# Patient Record
Sex: Male | Born: 1995 | Race: White | Hispanic: No | Marital: Single | Smoking: Never smoker
Health system: Southern US, Community
[De-identification: ages and names within clinical notes are randomized; demographics above are authoritative.]

---
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
## 2020-05-21 ENCOUNTER — Emergency Department

## 2020-05-21 ENCOUNTER — Other Ambulatory Visit: Payer: Self-pay

## 2020-05-21 ENCOUNTER — Encounter: Payer: Self-pay | Admitting: Emergency Medicine

## 2020-05-21 ENCOUNTER — Emergency Department
Admission: EM | Admit: 2020-05-21 | Discharge: 2020-05-21 | Disposition: A | Discharge status: Home / Self Care | Attending: Emergency Medicine | Admitting: Emergency Medicine

## 2020-05-21 DIAGNOSIS — R109 Unspecified abdominal pain: Secondary | ICD-10-CM | POA: Diagnosis present

## 2020-05-21 DIAGNOSIS — N200 Calculus of kidney: Secondary | ICD-10-CM | POA: Insufficient documentation

## 2020-05-21 LAB — URINALYSIS, COMPLETE (UACMP) WITH MICROSCOPIC
Bacteria, UA: NONE SEEN
Bilirubin Urine: NEGATIVE
Glucose, UA: NEGATIVE mg/dL
Ketones, ur: NEGATIVE mg/dL
Leukocytes,Ua: NEGATIVE
Nitrite: NEGATIVE
Protein, ur: 30 mg/dL — AB
Specific Gravity, Urine: 1.029 (ref 1.005–1.030)
Squamous Epithelial / HPF: NONE SEEN (ref 0–5)
pH: 5 (ref 5.0–8.0)

## 2020-05-21 LAB — CBC
HCT: 46.5 % (ref 39.0–52.0)
Hemoglobin: 16.4 g/dL (ref 13.0–17.0)
MCH: 29.8 pg (ref 26.0–34.0)
MCHC: 35.3 g/dL (ref 30.0–36.0)
MCV: 84.5 fL (ref 80.0–100.0)
Platelets: 242 10*3/uL (ref 150–400)
RBC: 5.5 MIL/uL (ref 4.22–5.81)
RDW: 11.8 % (ref 11.5–15.5)
WBC: 13.3 10*3/uL — ABNORMAL HIGH (ref 4.0–10.5)
nRBC: 0 % (ref 0.0–0.2)

## 2020-05-21 LAB — BASIC METABOLIC PANEL
Anion gap: 11 (ref 5–15)
BUN: 15 mg/dL (ref 6–20)
CO2: 25 mmol/L (ref 22–32)
Calcium: 9.4 mg/dL (ref 8.9–10.3)
Chloride: 102 mmol/L (ref 98–111)
Creatinine, Ser: 1.22 mg/dL (ref 0.61–1.24)
GFR calc Af Amer: >60 mL/min (ref >60)
GFR calc non Af Amer: >60 mL/min (ref >60)
Glucose, Bld: 169 mg/dL — ABNORMAL HIGH (ref 70–99)
Potassium: 3.7 mmol/L (ref 3.5–5.1)
Sodium: 138 mmol/L (ref 135–145)

## 2020-05-21 MED ORDER — NAPROXEN 500 MG PO TABS: 500.0000 mg | ORAL_TABLET | Freq: Two times a day (BID) | ORAL | 0 refills | Status: AC

## 2020-05-21 MED ORDER — ONDANSETRON HCL 4 MG/2ML IJ SOLN
4.0000 mg | Freq: Once | INTRAMUSCULAR | Status: AC
  Administered 2020-05-21: 4 mg via INTRAVENOUS

## 2020-05-21 MED ORDER — HYDROCODONE-ACETAMINOPHEN 5-325 MG PO TABS: 1.0000 | ORAL_TABLET | Freq: Four times a day (QID) | ORAL | 0 refills | Status: AC | PRN

## 2020-05-21 MED ORDER — TAMSULOSIN HCL 0.4 MG PO CAPS: 0.4000 mg | ORAL_CAPSULE | Freq: Every day | ORAL | 0 refills | Status: AC

## 2020-05-21 MED ORDER — KETOROLAC TROMETHAMINE 30 MG/ML IJ SOLN
INTRAMUSCULAR | Status: AC
  Filled 2020-05-21: qty 1

## 2020-05-21 MED ORDER — KETOROLAC TROMETHAMINE 30 MG/ML IJ SOLN
30.0000 mg | Freq: Once | INTRAMUSCULAR | Status: AC
  Administered 2020-05-21: 30 mg via INTRAVENOUS
  Filled 2020-05-21: qty 1

## 2020-05-21 MED ORDER — FENTANYL CITRATE (PF) 100 MCG/2ML IJ SOLN
50.0000 ug | INTRAMUSCULAR | Status: DC | PRN
  Administered 2020-05-21: 50 ug via INTRAVENOUS

## 2020-05-21 NOTE — ED Triage Notes (Signed)
Pt arrived via POV with c/o left flank pain since 9am, along with vomiting. Pt has hx of kidney stones on L side feels the same.

## 2020-05-21 NOTE — ED Provider Notes (Signed)
[facility] Emergency Department Provider Note ____________________________________________   First MD Initiated Contact with Patient 05/21/20 1420     (approximate)  I have reviewed the triage vital signs and the nursing notes.   HISTORY  Chief Complaint Flank Pain  HPI Martin Sheppard. is a 24 y.o. male who presents to the emergency department for treatment and evaluation of left flank pain that started acutely at 9 AM this morning.  He is also had some nausea and vomiting.  History of kidney stones few months ago.  No alleviating measures attempted prior to arrival.         History reviewed. No pertinent past medical history.  There are no problems to display for this patient.   History reviewed. No pertinent surgical history.  Prior to Admission medications   Medication Sig Start Date End Date Taking? Authorizing Provider  HYDROcodone-acetaminophen (NORCO/VICODIN) 5-325 MG tablet Take 1 tablet by mouth every 6 (six) hours as needed for up to 3 days for severe pain. 05/21/20 05/24/20  Larri Brewton, Rulon Eisenmenger B, FNP  naproxen (NAPROSYN) 500 MG tablet Take 1 tablet (500 mg total) by mouth 2 (two) times daily with a meal. 05/21/20   Cristyn Crossno B, FNP  tamsulosin (FLOMAX) 0.4 MG CAPS capsule Take 1 capsule (0.4 mg total) by mouth daily. 05/21/20   Chinita Pester, FNP    Allergies Patient has no known allergies.  No family history on file.  Social History Social History   Tobacco Use  . Smoking status: Never Smoker  . Smokeless tobacco: Never Used  Substance Use Topics  . Alcohol use: Not on file  . Drug use: Not on file    Review of Systems  Constitutional: No fever/chills Eyes: No visual changes. ENT: No sore throat. Cardiovascular: Denies chest pain. Respiratory: Denies shortness of breath. Gastrointestinal: Positive for nausea and vomiting no diarrhea.  No constipation. Genitourinary: Negative for dysuria. Musculoskeletal: Positive  for left flank pain Skin: Negative for rash. Neurological: Negative for headaches, focal weakness or numbness.  ____________________________________________   PHYSICAL EXAM:  VITAL SIGNS: ED Triage Vitals [05/21/20 1250]  Enc Vitals Group     BP 121/65     Pulse Rate 88     Resp 18     Temp 98 F (36.7 C)     Temp Source Oral     SpO2 100 %     Weight 200 lb (90.7 kg)     Height 5\' 8"  (1.727 m)     Head Circumference      Peak Flow      Pain Score 10     Pain Loc      Pain Edu?      Excl. in GC?     Constitutional: Alert and oriented. Well appearing and in no acute distress. Eyes: Conjunctivae are normal. PERRL. EOMI. Head: Atraumatic. Nose: No congestion/rhinnorhea. Mouth/Throat: Mucous membranes are moist.  Oropharynx non-erythematous. Neck: No stridor.   Hematological/Lymphatic/Immunilogical: No cervical lymphadenopathy. Cardiovascular: Normal rate, regular rhythm. Grossly normal heart sounds.  Good peripheral circulation. Respiratory: Normal respiratory effort.  No retractions. Lungs CTAB. Gastrointestinal: Soft and nontender. No distention. No abdominal bruits. No CVA tenderness. Genitourinary:  Musculoskeletal: No lower extremity tenderness nor edema.  No joint effusions. Neurologic:  Normal speech and language. No gross focal neurologic deficits are appreciated. No gait instability. Skin:  Skin is warm, dry and intact. No rash noted. Psychiatric: Mood and affect are normal. Speech and behavior are normal.  ____________________________________________  LABS (all labs ordered are listed, but only abnormal results are displayed)  Labs Reviewed  URINALYSIS, COMPLETE (UACMP) WITH MICROSCOPIC - Abnormal; Notable for the following components:      Result Value   Color, Urine YELLOW (*)    APPearance HAZY (*)    Hgb urine dipstick MODERATE (*)    Protein, ur 30 (*)    All other components within normal limits  BASIC METABOLIC PANEL - Abnormal; Notable for the  following components:   Glucose, Bld 169 (*)    All other components within normal limits  CBC - Abnormal; Notable for the following components:   WBC 13.3 (*)    All other components within normal limits   ____________________________________________  EKG  Not indicated ____________________________________________  RADIOLOGY  ED MD interpretation:    2 mm calculus in the left UVJ without hydronephrosis.  I, Sherrie George, personally viewed and evaluated these images (plain radiographs) as part of my medical decision making, as well as reviewing the written report by the radiologist.  Official radiology report(s): CT Renal Stone Study  Result Date: 05/21/2020 CLINICAL DATA:  Left flank pain with vomiting EXAM: CT ABDOMEN AND PELVIS WITHOUT CONTRAST TECHNIQUE: Multidetector CT imaging of the abdomen and pelvis was performed following the standard protocol without oral or IV contrast. COMPARISON:  None. FINDINGS: Lower chest: Lung bases are clear. There is air in the distal most aspect of the esophagus. Hepatobiliary: No focal liver lesions are evident on this noncontrast enhanced study. Gallbladder wall is not appreciably thickened. There is no biliary duct dilatation. Pancreas: There is no pancreatic mass or inflammatory focus. Spleen: No splenic lesions are evident. Adrenals/Urinary Tract: Adrenals bilaterally appear unremarkable. Kidneys bilaterally show no appreciable mass or hydronephrosis on either side. There are scattered tiny calculi in the left kidney. No appreciable calculi in the right kidney. There is a 2 x 1 mm calculus at the left ureterovesical junction. No other ureteral calculi evident. Urinary bladder is midline with wall thickness within normal limits. Stomach/Bowel: There is no appreciable bowel wall or mesenteric thickening. Terminal ileum appears unremarkable. There is no appreciable free air or portal venous air. Vascular/Lymphatic: No abdominal aortic aneurysm. No  vascular lesions evident. There is no evident adenopathy in the abdomen or pelvis. Reproductive: There are occasional prostatic calculi. Prostate and seminal vesicles are normal in size and contour. No evident pelvic mass. Other: Appendix appears normal. No evident abscess or ascites in the abdomen or pelvis. Musculoskeletal: There are no blastic or lytic bone lesions. No abdominal wall or intramuscular lesions. IMPRESSION: 1. 2 x 1 mm calculus left ureterovesical junction without appreciable hydronephrosis. Tiny calculi noted within the left kidney, nonobstructing. 2. No bowel wall thickening or bowel obstruction. No abscess in the abdomen or pelvis. Appendix appears normal. Electronically Signed   By: Lowella Grip III M.D.   On: 05/21/2020 14:13    ____________________________________________   PROCEDURES  Procedure(s) performed (including Critical Care):  Procedures  ____________________________________________   INITIAL IMPRESSION / ASSESSMENT AND PLAN     24 year old male presenting to the emergency department for treatment and evaluation of sudden onset left flank pain.  See HPI for further details.  DIFFERENTIAL DIAGNOSIS  Ureterolithiasis, nephrolithiasis, STI, pyelonephritis  ED COURSE  Review of labs and CT performed while patient awaiting ER room assignment shows a 2 x 1 mm stone at the UVJ on the left.  Pain is well controlled at this time.  Plan will be to give him IV Toradol and discharge  him home with Flomax, Norco, and Naprosyn.  He is to follow-up with urology for symptoms that are not improving over the week.  He is to return to the emergency department for symptoms of concern if he is unable to see primary care or the urologist. ____________________________________________   FINAL CLINICAL IMPRESSION(S) / ED DIAGNOSES  Final diagnoses:  Nephrolithiasis     ED Discharge Orders         Ordered    tamsulosin (FLOMAX) 0.4 MG CAPS capsule  Daily     05/21/20  1451    HYDROcodone-acetaminophen (NORCO/VICODIN) 5-325 MG tablet  Every 6 hours PRN     05/21/20 1451    naproxen (NAPROSYN) 500 MG tablet  2 times daily with meals     05/21/20 1451           Martin Sheppard. was evaluated in Emergency Department on 05/21/2020 for the symptoms described in the history of present illness. He was evaluated in the context of the global COVID-19 pandemic, which necessitated consideration that the patient might be at risk for infection with the SARS-CoV-2 virus that causes COVID-19. Institutional protocols and algorithms that pertain to the evaluation of patients at risk for COVID-19 are in a state of rapid change based on information released by regulatory bodies including the CDC and federal and state organizations. These policies and algorithms were followed during the patient's care in the ED.   Note:  This document was prepared using Dragon voice recognition software and may include unintentional dictation errors.   Chinita Pester, FNP 05/21/20 1456    Jene Every, MD 05/21/20 [phone]

## 2020-05-21 NOTE — Discharge Instructions (Signed)
Please follow-up with the urologist if you are still having pain after 1 week.  Take medication as prescribed and until finished.  Return to the emergency department for symptoms of change or worsen if you are unable to schedule an appointment with your primary care provider or the urologist.

## 2021-07-02 IMAGING — CT CT RENAL STONE PROTOCOL
2 of 4 series · 16 of 46 positions shown, 18 images · non-contrast
Comparison: None.

CLINICAL DATA: Left flank pain with vomiting

EXAM:
CT ABDOMEN AND PELVIS WITHOUT CONTRAST
TECHNIQUE: Multidetector CT imaging of the abdomen and pelvis was performed
following the standard protocol without oral or IV contrast.

[Series 2: stone full standard · axial · 0.72mm/px · z∈[-526,-66]mm · 13 of 102 slices shown, 15 images]
[im 5/102  soft-tissue]
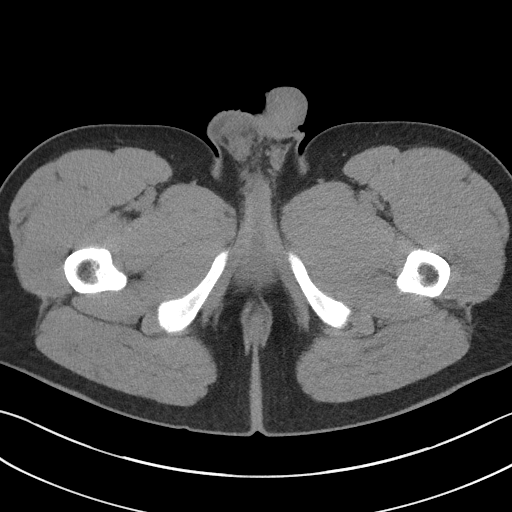
[im 5/102  bone]
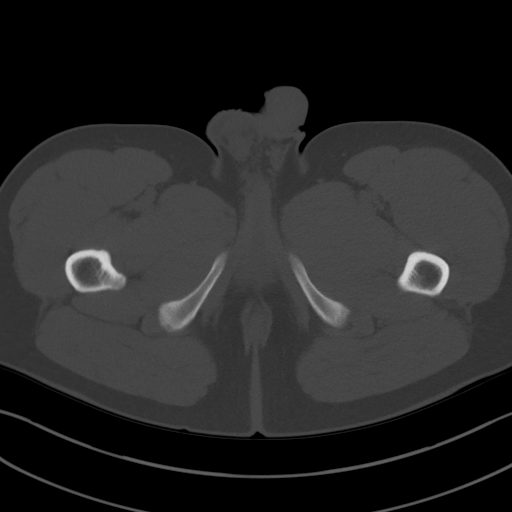
[im 13/102  soft-tissue]
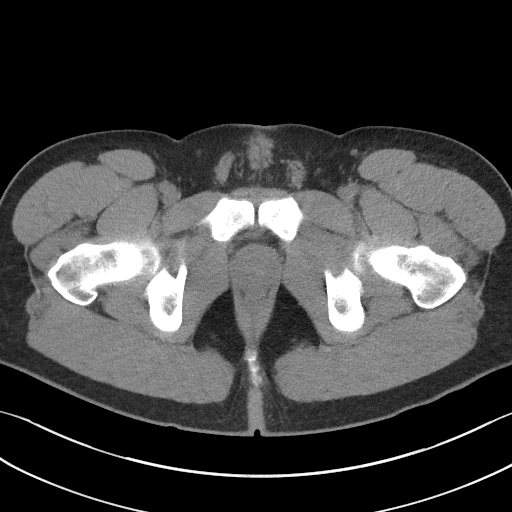
[im 21/102  soft-tissue]
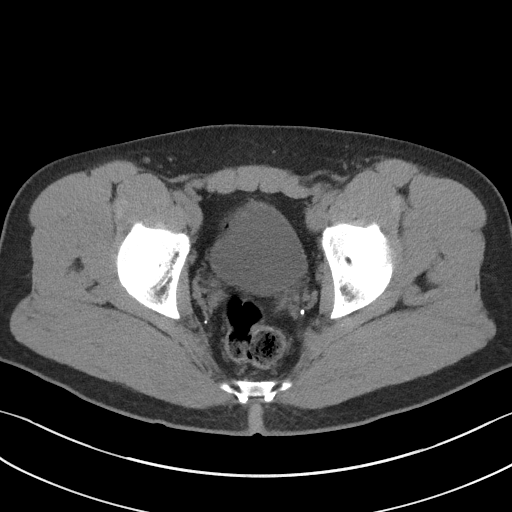
[im 29/102  soft-tissue]
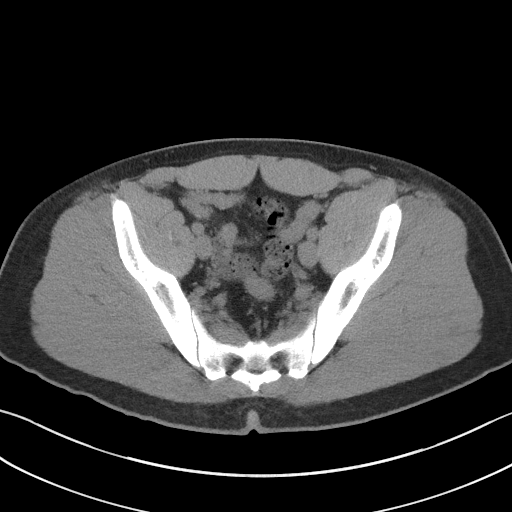
[im 37/102  soft-tissue]
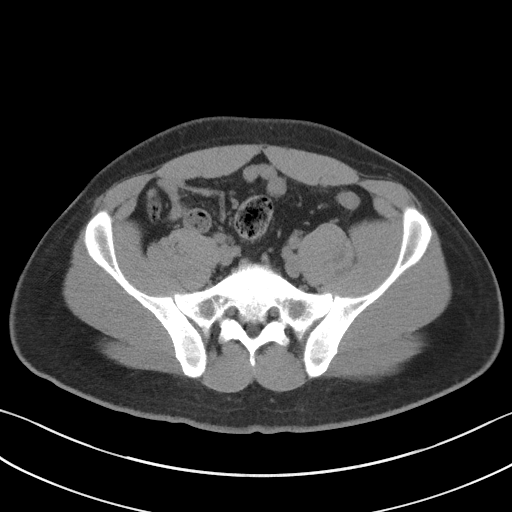
[im 45/102  soft-tissue]
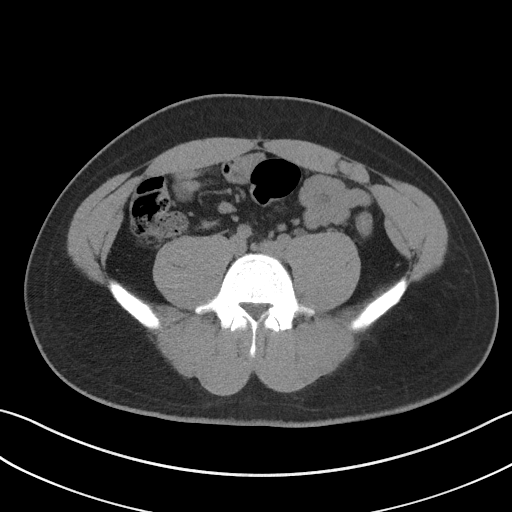
[im 53/102  soft-tissue]
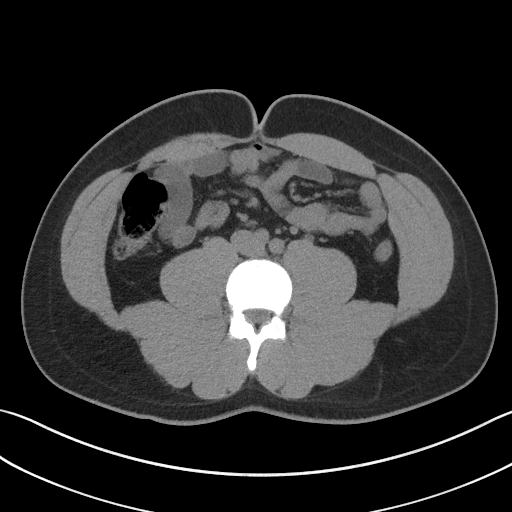
[im 57/102  soft-tissue]
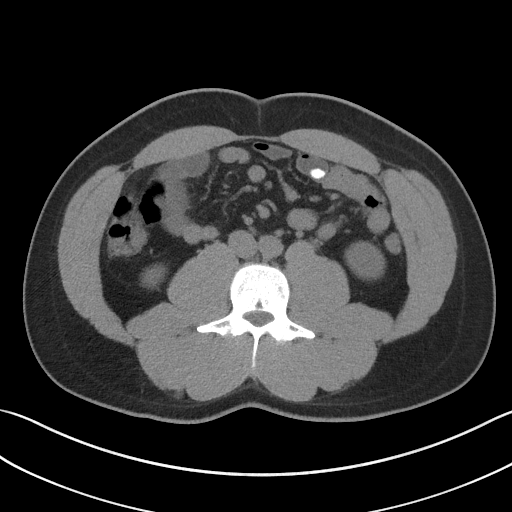
[im 65/102  soft-tissue]
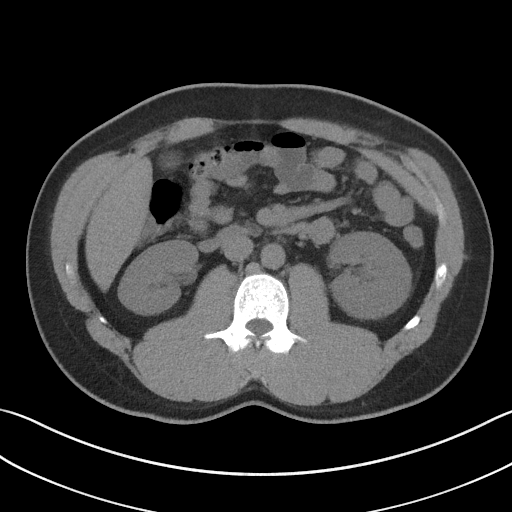
[im 65/102  bone]
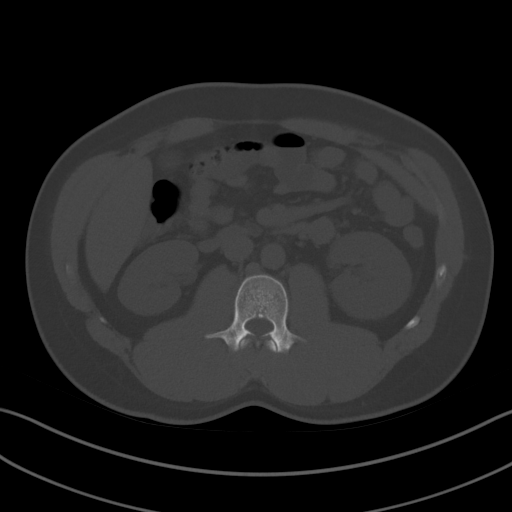
[im 73/102  soft-tissue]
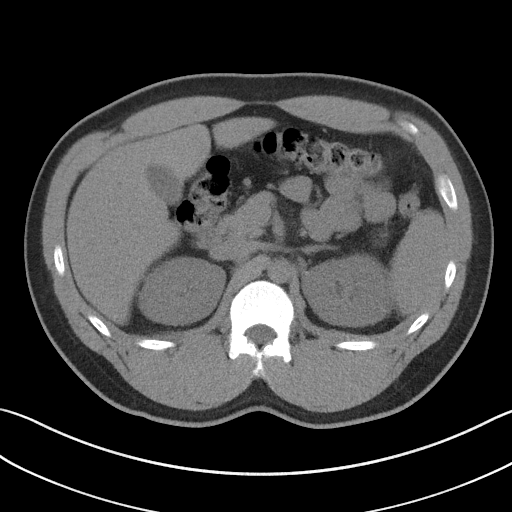
[im 81/102  soft-tissue]
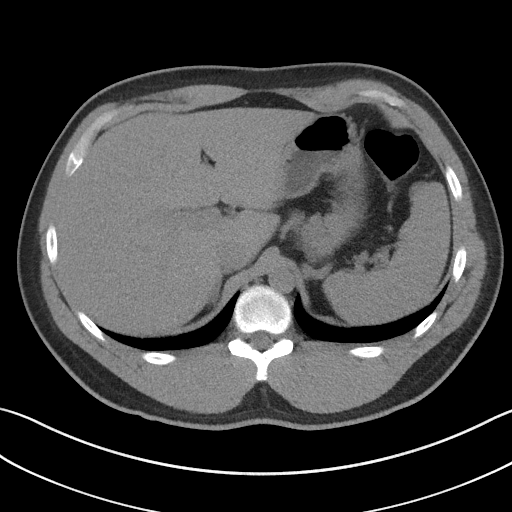
[im 89/102  soft-tissue]
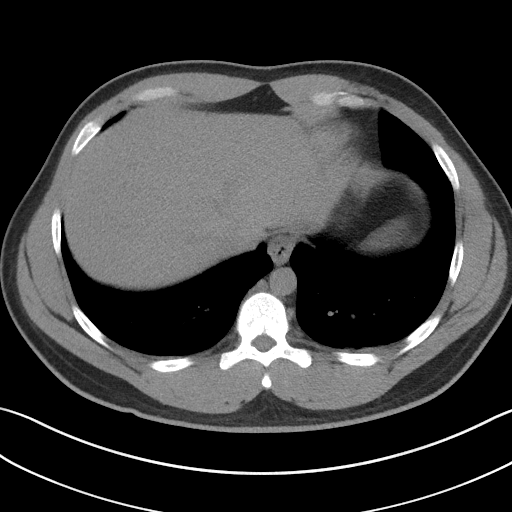
[im 97/102  soft-tissue]
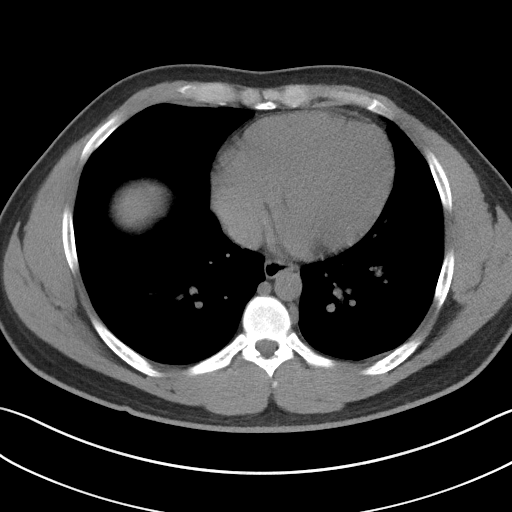

[Series 5: coronal · coronal · 0.74mm/px · 3 of 138 slices shown]
[im 46/138  soft-tissue]
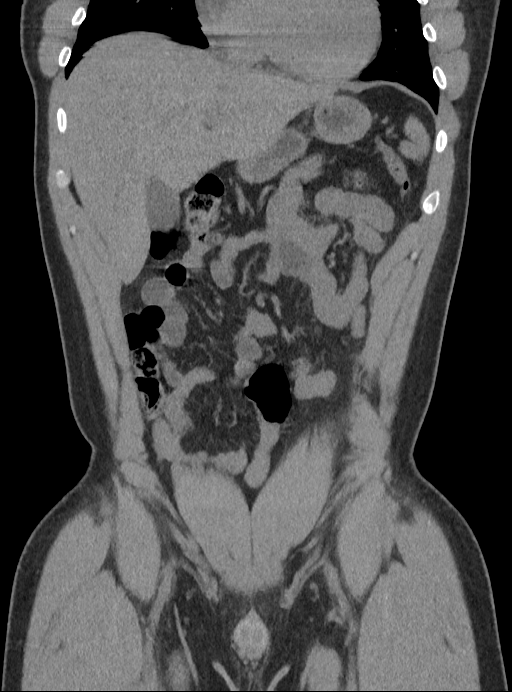
[im 61/138  soft-tissue]
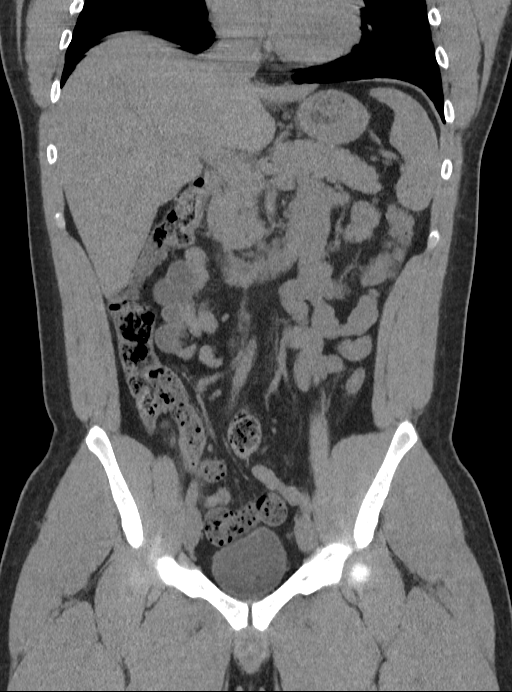
[im 77/138  soft-tissue]
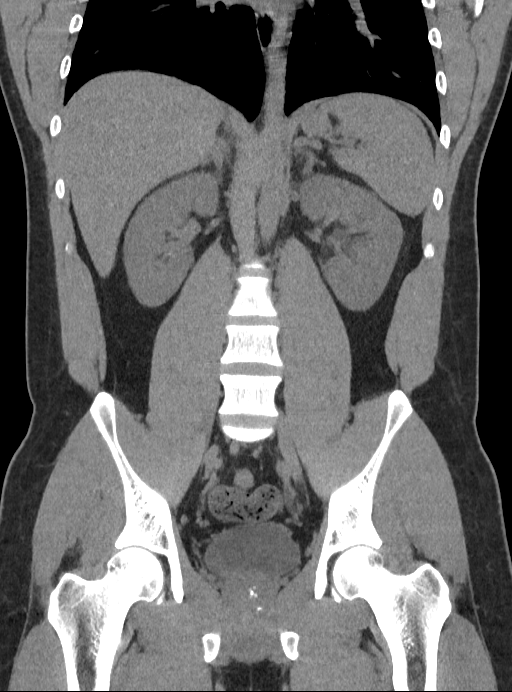

[16 of 46 positions shown; findings below may reference images not displayed]

FINDINGS: Lower chest: Lung bases are clear. There is air in the distal most
aspect of the esophagus.

Hepatobiliary: No focal liver lesions are evident on this
noncontrast enhanced study. Gallbladder wall is not appreciably
thickened. There is no biliary duct dilatation.

Pancreas: There is no pancreatic mass or inflammatory focus.

Spleen: No splenic lesions are evident.

Adrenals/Urinary Tract: Adrenals bilaterally appear unremarkable.
Kidneys bilaterally show no appreciable mass or hydronephrosis on
either side. There are scattered tiny calculi in the left kidney. No
appreciable calculi in the right kidney. There is a 2 x 1 mm
calculus at the left ureterovesical junction. No other ureteral
calculi evident. Urinary bladder is midline with wall thickness
within normal limits.

Stomach/Bowel: There is no appreciable bowel wall or mesenteric
thickening. Terminal ileum appears unremarkable. There is no
appreciable free air or portal venous air.

Vascular/Lymphatic: No abdominal aortic aneurysm. No vascular
lesions evident. There is no evident adenopathy in the abdomen or
pelvis.

Reproductive: There are occasional prostatic calculi. Prostate and
seminal vesicles are normal in size and contour. No evident pelvic
mass.

Other: Appendix appears normal. No evident abscess or ascites in the
abdomen or pelvis.

Musculoskeletal: There are no blastic or lytic bone lesions. No
abdominal wall or intramuscular lesions.
IMPRESSION: 1. 2 x 1 mm calculus left ureterovesical junction without
appreciable hydronephrosis. Tiny calculi noted within the left
kidney, nonobstructing.

2. No bowel wall thickening or bowel obstruction. No abscess in the
abdomen or pelvis. Appendix appears normal.
